# Patient Record
Sex: Male | Born: 1970 | Race: Black or African American | Hispanic: No | Marital: Single | Smoking: Current every day smoker
Health system: Southern US, Community
[De-identification: ages and names within clinical notes are randomized; demographics above are authoritative.]

## PROBLEM LIST (undated history)

## (undated) HISTORY — PX: HERNIA REPAIR: SHX51

---

## 2017-11-09 ENCOUNTER — Other Ambulatory Visit: Payer: Self-pay

## 2017-11-09 ENCOUNTER — Emergency Department (HOSPITAL_COMMUNITY): Payer: No Typology Code available for payment source

## 2017-11-09 ENCOUNTER — Emergency Department (HOSPITAL_COMMUNITY)
Admission: EM | Admit: 2017-11-09 | Discharge: 2017-11-09 | Disposition: A | Discharge status: Home / Self Care | Payer: No Typology Code available for payment source | Attending: Emergency Medicine | Admitting: Emergency Medicine

## 2017-11-09 ENCOUNTER — Encounter (HOSPITAL_COMMUNITY): Payer: Self-pay

## 2017-11-09 DIAGNOSIS — Y92410 Unspecified street and highway as the place of occurrence of the external cause: Secondary | ICD-10-CM | POA: Diagnosis not present

## 2017-11-09 DIAGNOSIS — M25512 Pain in left shoulder: Secondary | ICD-10-CM | POA: Insufficient documentation

## 2017-11-09 DIAGNOSIS — Y939 Activity, unspecified: Secondary | ICD-10-CM | POA: Insufficient documentation

## 2017-11-09 DIAGNOSIS — Y998 Other external cause status: Secondary | ICD-10-CM | POA: Diagnosis not present

## 2017-11-09 DIAGNOSIS — F17210 Nicotine dependence, cigarettes, uncomplicated: Secondary | ICD-10-CM | POA: Insufficient documentation

## 2017-11-09 DIAGNOSIS — M545 Low back pain, unspecified: Secondary | ICD-10-CM

## 2017-11-09 MED ORDER — IBUPROFEN 600 MG PO TABS: 600.0000 mg | ORAL_TABLET | Freq: Four times a day (QID) | ORAL | 0 refills | Status: AC | PRN

## 2017-11-09 MED ORDER — IBUPROFEN 200 MG PO TABS
600.0000 mg | ORAL_TABLET | Freq: Once | ORAL | Status: AC
  Administered 2017-11-09: 600 mg via ORAL
  Filled 2017-11-09: qty 3

## 2017-11-09 MED ORDER — METHOCARBAMOL 500 MG PO TABS: 500.0000 mg | ORAL_TABLET | Freq: Two times a day (BID) | ORAL | 0 refills | Status: AC
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
## 2017-11-09 NOTE — Discharge Instructions (Signed)
X-rays look good. The pain your experiencing is likely due to muscle strain, you may take Ibuprofen and Robaxin as needed for pain management. Do not combine with any pain reliever other than tylenol. The muscle soreness should improve over the next week. Follow up with your family doctor in the next week for a recheck if you are still having symptoms. Return to ED if pain is worsening, you develop weakness or numbness of extremities, or new or concerning symptoms develop.
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
## 2021-03-11 ENCOUNTER — Other Ambulatory Visit: Payer: Self-pay | Admitting: Internal Medicine

## 2021-03-11 DIAGNOSIS — R051 Acute cough: Secondary | ICD-10-CM | POA: Diagnosis not present

## 2021-03-11 DIAGNOSIS — E559 Vitamin D deficiency, unspecified: Secondary | ICD-10-CM | POA: Diagnosis not present

## 2021-03-11 DIAGNOSIS — J302 Other seasonal allergic rhinitis: Secondary | ICD-10-CM | POA: Diagnosis not present

## 2021-03-11 DIAGNOSIS — Z13220 Encounter for screening for lipoid disorders: Secondary | ICD-10-CM | POA: Diagnosis not present

## 2021-03-11 DIAGNOSIS — Z0000 Encounter for general adult medical examination without abnormal findings: Secondary | ICD-10-CM | POA: Diagnosis not present

## 2021-03-11 DIAGNOSIS — I10 Essential (primary) hypertension: Secondary | ICD-10-CM | POA: Diagnosis not present

## 2021-03-11 DIAGNOSIS — Z125 Encounter for screening for malignant neoplasm of prostate: Secondary | ICD-10-CM | POA: Diagnosis not present

## 2021-03-11 DIAGNOSIS — R7303 Prediabetes: Secondary | ICD-10-CM | POA: Diagnosis not present

## 2021-03-12 LAB — COMPLETE METABOLIC PANEL WITH GFR
AG Ratio: 1.2 (calc) (ref 1.0–2.5)
ALT: 21 U/L (ref 9–46)
AST: 21 U/L (ref 10–40)
Albumin: 3.9 g/dL (ref 3.6–5.1)
Alkaline phosphatase (APISO): 56 U/L (ref 36–130)
BUN: 17 mg/dL (ref 7–25)
CO2: 24 mmol/L (ref 20–32)
Calcium: 8.8 mg/dL (ref 8.6–10.3)
Chloride: 105 mmol/L (ref 98–110)
Creat: 1.08 mg/dL (ref 0.60–1.29)
Globulin: 3.2 g/dL (calc) (ref 1.9–3.7)
Glucose, Bld: 120 mg/dL — ABNORMAL HIGH (ref 65–99)
Potassium: 4.1 mmol/L (ref 3.5–5.3)
Sodium: 140 mmol/L (ref 135–146)
Total Bilirubin: 0.4 mg/dL (ref 0.2–1.2)
Total Protein: 7.1 g/dL (ref 6.1–8.1)
eGFR: 84 mL/min/{1.73_m2} (ref >=60)

## 2021-03-12 LAB — CBC
HCT: 42.5 % (ref 38.5–50.0)
Hemoglobin: 14.5 g/dL (ref 13.2–17.1)
MCH: 31.8 pg (ref 27.0–33.0)
MCHC: 34.1 g/dL (ref 32.0–36.0)
MCV: 93.2 fL (ref 80.0–100.0)
MPV: 9.3 fL (ref 7.5–12.5)
Platelets: 400 10*3/uL (ref 140–400)
RBC: 4.56 10*6/uL (ref 4.20–5.80)
RDW: 12.9 % (ref 11.0–15.0)
WBC: 6.7 10*3/uL (ref 3.8–10.8)

## 2021-03-12 LAB — TSH: TSH: 0.66 mIU/L (ref 0.40–4.50)

## 2021-03-12 LAB — VITAMIN D 25 HYDROXY (VIT D DEFICIENCY, FRACTURES): Vit D, 25-Hydroxy: 19 ng/mL — ABNORMAL LOW (ref 30–100)

## 2021-03-12 LAB — LIPID PANEL
Cholesterol: 134 mg/dL (ref <200)
HDL: 65 mg/dL (ref >=40)
LDL Cholesterol (Calc): 51 mg/dL (calc)
Non-HDL Cholesterol (Calc): 69 mg/dL (calc) (ref <130)
Total CHOL/HDL Ratio: 2.1 (calc) (ref <5.0)
Triglycerides: 96 mg/dL (ref <150)

## 2021-03-12 LAB — PSA: PSA: 0.96 ng/mL (ref <=4.00)

## 2022-03-09 DIAGNOSIS — M25512 Pain in left shoulder: Secondary | ICD-10-CM | POA: Diagnosis not present

## 2022-03-09 DIAGNOSIS — R030 Elevated blood-pressure reading, without diagnosis of hypertension: Secondary | ICD-10-CM | POA: Diagnosis not present

## 2022-03-09 DIAGNOSIS — E1165 Type 2 diabetes mellitus with hyperglycemia: Secondary | ICD-10-CM | POA: Diagnosis not present

## 2022-03-11 ENCOUNTER — Other Ambulatory Visit: Payer: Self-pay | Admitting: Internal Medicine

## 2022-03-11 DIAGNOSIS — Z0000 Encounter for general adult medical examination without abnormal findings: Secondary | ICD-10-CM | POA: Diagnosis not present

## 2022-03-11 DIAGNOSIS — J302 Other seasonal allergic rhinitis: Secondary | ICD-10-CM | POA: Diagnosis not present

## 2022-03-11 DIAGNOSIS — Z125 Encounter for screening for malignant neoplasm of prostate: Secondary | ICD-10-CM | POA: Diagnosis not present

## 2022-03-11 DIAGNOSIS — I10 Essential (primary) hypertension: Secondary | ICD-10-CM | POA: Diagnosis not present

## 2022-03-11 DIAGNOSIS — E1165 Type 2 diabetes mellitus with hyperglycemia: Secondary | ICD-10-CM | POA: Diagnosis not present

## 2022-03-11 DIAGNOSIS — E559 Vitamin D deficiency, unspecified: Secondary | ICD-10-CM | POA: Diagnosis not present

## 2022-03-12 LAB — TSH: TSH: 0.57 mIU/L (ref 0.40–4.50)

## 2022-03-12 LAB — COMPLETE METABOLIC PANEL WITH GFR
AG Ratio: 1.1 (calc) (ref 1.0–2.5)
ALT: 18 U/L (ref 9–46)
AST: 15 U/L (ref 10–35)
Albumin: 4.1 g/dL (ref 3.6–5.1)
Alkaline phosphatase (APISO): 91 U/L (ref 35–144)
BUN: 11 mg/dL (ref 7–25)
CO2: 26 mmol/L (ref 20–32)
Calcium: 9.6 mg/dL (ref 8.6–10.3)
Chloride: 97 mmol/L — ABNORMAL LOW (ref 98–110)
Creat: 1.11 mg/dL (ref 0.70–1.30)
Globulin: 3.7 g/dL (calc) (ref 1.9–3.7)
Glucose, Bld: 437 mg/dL — ABNORMAL HIGH (ref 65–99)
Potassium: 4.4 mmol/L (ref 3.5–5.3)
Sodium: 136 mmol/L (ref 135–146)
Total Bilirubin: 0.7 mg/dL (ref 0.2–1.2)
Total Protein: 7.8 g/dL (ref 6.1–8.1)
eGFR: 81 mL/min/{1.73_m2} (ref >=60)

## 2022-03-12 LAB — LIPID PANEL
Cholesterol: 179 mg/dL (ref <200)
HDL: 43 mg/dL (ref >=40)
LDL Cholesterol (Calc): 104 mg/dL (calc) — ABNORMAL HIGH
Non-HDL Cholesterol (Calc): 136 mg/dL (calc) — ABNORMAL HIGH (ref <130)
Total CHOL/HDL Ratio: 4.2 (calc) (ref <5.0)
Triglycerides: 200 mg/dL — ABNORMAL HIGH (ref <150)

## 2022-03-12 LAB — CBC
HCT: 42.9 % (ref 38.5–50.0)
Hemoglobin: 15 g/dL (ref 13.2–17.1)
MCH: 32.2 pg (ref 27.0–33.0)
MCHC: 35 g/dL (ref 32.0–36.0)
MCV: 92.1 fL (ref 80.0–100.0)
MPV: 10.1 fL (ref 7.5–12.5)
Platelets: 369 10*3/uL (ref 140–400)
RBC: 4.66 10*6/uL (ref 4.20–5.80)
RDW: 12 % (ref 11.0–15.0)
WBC: 9.2 10*3/uL (ref 3.8–10.8)

## 2022-03-12 LAB — PSA: PSA: 0.65 ng/mL (ref <=4.00)

## 2022-03-12 LAB — VITAMIN D 25 HYDROXY (VIT D DEFICIENCY, FRACTURES): Vit D, 25-Hydroxy: 7 ng/mL — ABNORMAL LOW (ref 30–100)

## 2022-03-15 DIAGNOSIS — J302 Other seasonal allergic rhinitis: Secondary | ICD-10-CM | POA: Diagnosis not present

## 2022-03-15 DIAGNOSIS — J069 Acute upper respiratory infection, unspecified: Secondary | ICD-10-CM | POA: Diagnosis not present

## 2022-03-15 DIAGNOSIS — E1165 Type 2 diabetes mellitus with hyperglycemia: Secondary | ICD-10-CM | POA: Diagnosis not present

## 2022-03-15 DIAGNOSIS — I10 Essential (primary) hypertension: Secondary | ICD-10-CM | POA: Diagnosis not present

## 2022-04-11 DIAGNOSIS — E1165 Type 2 diabetes mellitus with hyperglycemia: Secondary | ICD-10-CM | POA: Diagnosis not present

## 2022-04-11 DIAGNOSIS — I10 Essential (primary) hypertension: Secondary | ICD-10-CM | POA: Diagnosis not present

## 2022-06-27 DIAGNOSIS — I10 Essential (primary) hypertension: Secondary | ICD-10-CM | POA: Diagnosis not present

## 2022-06-27 DIAGNOSIS — J302 Other seasonal allergic rhinitis: Secondary | ICD-10-CM | POA: Diagnosis not present

## 2022-06-27 DIAGNOSIS — E1165 Type 2 diabetes mellitus with hyperglycemia: Secondary | ICD-10-CM | POA: Diagnosis not present

## 2022-07-27 DIAGNOSIS — I10 Essential (primary) hypertension: Secondary | ICD-10-CM | POA: Diagnosis not present

## 2022-07-27 DIAGNOSIS — E1165 Type 2 diabetes mellitus with hyperglycemia: Secondary | ICD-10-CM | POA: Diagnosis not present
